# Patient Record
Sex: Female | Born: 1955 | Race: White | Hispanic: No | Marital: Single | Smoking: Never smoker
Health system: Southern US, Community
[De-identification: ages and names within clinical notes are randomized; demographics above are authoritative.]

## PROBLEM LIST (undated history)

## (undated) DIAGNOSIS — Z923 Personal history of irradiation: Secondary | ICD-10-CM

## (undated) DIAGNOSIS — Z9221 Personal history of antineoplastic chemotherapy: Secondary | ICD-10-CM

## (undated) DIAGNOSIS — Z87442 Personal history of urinary calculi: Secondary | ICD-10-CM

## (undated) DIAGNOSIS — C801 Malignant (primary) neoplasm, unspecified: Secondary | ICD-10-CM

## (undated) DIAGNOSIS — I10 Essential (primary) hypertension: Secondary | ICD-10-CM

## (undated) HISTORY — DX: Personal history of antineoplastic chemotherapy: Z92.21

## (undated) HISTORY — DX: Personal history of irradiation: Z92.3

## (undated) HISTORY — PX: BREAST LUMPECTOMY: SHX2

---

## 2010-06-28 HISTORY — PX: BREAST LUMPECTOMY: SHX2

## 2016-03-09 HISTORY — PX: LITHOTRIPSY: SUR834

## 2020-03-14 ENCOUNTER — Other Ambulatory Visit: Payer: Self-pay

## 2020-03-14 ENCOUNTER — Ambulatory Visit (INDEPENDENT_AMBULATORY_CARE_PROVIDER_SITE_OTHER): Payer: No Typology Code available for payment source | Admitting: Urology

## 2020-03-14 ENCOUNTER — Ambulatory Visit
Admission: RE | Admit: 2020-03-14 | Discharge: 2020-03-14 | Disposition: A | Discharge status: Home / Self Care | Payer: No Typology Code available for payment source | Source: Ambulatory Visit | Attending: Urology | Admitting: Urology

## 2020-03-14 VITALS — BP 173/77 | HR 85 | Ht 62.0 in | Wt 110.6 lb

## 2020-03-14 DIAGNOSIS — R300 Dysuria: Secondary | ICD-10-CM

## 2020-03-14 DIAGNOSIS — R109 Unspecified abdominal pain: Secondary | ICD-10-CM | POA: Diagnosis not present

## 2020-03-14 NOTE — Patient Instructions (Signed)
Kidney Stones  Kidney stones are solid, rock-like deposits that form inside of the kidneys. The kidneys are a pair of organs that make urine. A kidney stone may form in a kidney and move into other parts of the urinary tract, including the tubes that connect the kidneys to the bladder (ureters), the bladder, and the tube that carries urine out of the body (urethra). As the stone moves through these areas, it can cause intense pain and block the flow of urine. Kidney stones are created when high levels of certain minerals are found in the urine. The stones are usually passed out of the body through urination, but in some cases, medical treatment may be needed to remove them. What are the causes? Kidney stones may be caused by:  A condition in which certain glands produce too much parathyroid hormone (primary hyperparathyroidism), which causes too much calcium buildup in the blood.  A buildup of uric acid crystals in the bladder (hyperuricosuria). Uric acid is a chemical that the body produces when you eat certain foods. It usually exits the body in the urine.  Narrowing (stricture) of one or both of the ureters.  A kidney blockage that is present at birth (congenital obstruction).  Past surgery on the kidney or the ureters, such as gastric bypass surgery. What increases the risk? The following factors may make you more likely to develop this condition:  Having had a kidney stone in the past.  Having a family history of kidney stones.  Not drinking enough water.  Eating a diet that is high in protein, salt (sodium), or sugar.  Being overweight or obese. What are the signs or symptoms? Symptoms of a kidney stone may include:  Pain in the side of the abdomen, right below the ribs (flank pain). Pain usually spreads (radiates) to the groin.  Needing to urinate frequently or urgently.  Painful urination.  Blood in the urine (hematuria).  Nausea.  Vomiting.  Fever and chills. How  is this diagnosed? This condition may be diagnosed based on:  Your symptoms and medical history.  A physical exam.  Blood tests.  Urine tests. These may be done before and after the stone passes out of your body through urination.  Imaging tests, such as a CT scan, abdominal X-ray, or ultrasound.  A procedure to examine the inside of the bladder (cystoscopy). How is this treated? Treatment for kidney stones depends on the size, location, and makeup of the stones. Kidney stones will often pass out of the body through urination. You may need to:  Increase your fluid intake to help pass the stone. In some cases, you may be given fluids through an IV and may need to be monitored at the hospital.  Take medicine for pain.  Make changes in your diet to help prevent kidney stones from coming back. Sometimes, medical procedures are needed to remove a kidney stone. This may involve:  A procedure to break up kidney stones using: ? A focused beam of light (laser therapy). ? Shock waves (extracorporeal shock wave lithotripsy).  Surgery to remove kidney stones. This may be needed if you have severe pain or have stones that block your urinary tract. Follow these instructions at home: Medicines  Take over-the-counter and prescription medicines only as told by your health care provider.  Ask your health care provider if the medicine prescribed to you requires you to avoid driving or using heavy machinery. Eating and drinking  Drink enough fluid to keep your urine pale yellow.   You may be instructed to drink at least 8-10 glasses of water each day. This will help you pass the kidney stone.  If directed, change your diet. This may include: ? Limiting how much sodium you eat. ? Eating more fruits and vegetables. ? Limiting how much animal protein--such as red meat, poultry, fish, and eggs--you eat.  Follow instructions from your health care provider about eating or drinking  restrictions. General instructions  Collect urine samples as told by your health care provider. You may need to collect a urine sample: ? 24 hours after you pass the stone. ? 8-12 weeks after passing the kidney stone, and every 6-12 months after that.  Strain your urine every time you urinate, for as long as directed. Use the strainer that your health care provider recommends.  Do not throw out the kidney stone after passing it. Keep the stone so it can be tested by your health care provider. Testing the makeup of your kidney stone may help prevent you from getting kidney stones in the future.  Keep all follow-up visits as told by your health care provider. This is important. You may need follow-up X-rays or ultrasounds to make sure that your stone has passed. How is this prevented? To prevent another kidney stone:  Drink enough fluid to keep your urine pale yellow. This is the best way to prevent kidney stones.  Eat a healthy diet and follow recommendations from your health care provider about foods to avoid. You may be instructed to eat a low-protein diet. Recommendations vary depending on the type of kidney stone that you have.  Maintain a healthy weight. Where to find more information  National Kidney Foundation (NKF): www.kidney.org  [employer] (UCF): www.urologyhealth.org Contact a health care provider if:  You have pain that gets worse or does not get better with medicine. Get help right away if:  You have a fever or chills.  You develop severe pain.  You develop new abdominal pain.  You faint.  You are unable to urinate. Summary  Kidney stones are solid, rock-like deposits that form inside of the kidneys.  Kidney stones can cause nausea, vomiting, blood in the urine, abdominal pain, and the urge to urinate frequently.  Treatment for kidney stones depends on the size, location, and makeup of the stones. Kidney stones will often pass out of the body  through urination.  Kidney stones can be prevented by drinking enough fluids, eating a healthy diet, and maintaining a healthy weight. This information is not intended to replace advice given to you by your health care provider. Make sure you discuss any questions you have with your health care provider. Document Revised: 10/31/2018 Document Reviewed: 10/31/2018 Elsevier Patient Education  2020 Elsevier Inc.  

## 2020-03-14 NOTE — Progress Notes (Signed)
03/14/2020 2:46 PM   Kelly Hines 03-Sep-1955 [id number]  Referring provider: Ferdinand Cava, MD [facility] [place],  [place] [address]  CC: dysuria   HPI: Kelly Hines was referred over for dysuria and left LQ pain that started about 6 weeks ago. She hasn't seen a stone pass. The pain eased off and then she felt like a "stone stuck in her bladder". It felt "prickly". Now she has left flank, groin or bladder pain. She has no gross hematuria and was told her UA at PCP showed "no blood". Pain is manageable and more like a "discomfort". She also c/o constipation. She started Mag Citrate to tx constipation and prevent kidney stones.   She has h/o stones. She had ESWL in 2016 maybe twice that year for two stones in the left kidney. Done in [place]. She had left ESWL in 2017 for a "stone in the ureter" on the left and still gets pain over the LLQ site that comes and goes.   PMH: No past medical history on file.  Surgical History:   Home Medications:  Allergies as of 03/14/2020      Reactions   Sulfa Antibiotics Nausea And Vomiting      Medication List       Accurate as of March 14, 2020  2:46 PM. If you have any questions, ask your nurse or doctor.        lisinopril 20 MG tablet Commonly known as: ZESTRIL Take 20 mg by mouth daily.       Allergies:  Allergies  Allergen Reactions  . Sulfa Antibiotics Nausea And Vomiting    Family History: No family history on file.  Social History:  has no history on file for tobacco use, alcohol use, and drug use.   Physical Exam: BP (!) 173/77 (BP Location: Left Arm, Patient Position: Sitting, Cuff Size: Normal)   Pulse 85   Ht 5\' 2"  (1.575 m)   Wt 110 lb 9.6 oz (50.2 kg)   BMI 20.23 kg/m   Constitutional:  Alert and oriented, No acute distress. HEENT: [place] AT, moist mucus membranes.  Trachea midline, no masses. Cardiovascular: No clubbing, cyanosis, or edema. Respiratory: Normal respiratory effort, no increased work of  breathing. GI: Abdomen is soft, nontender, nondistended, no abdominal masses GU: No CVA tenderness Skin: No rashes, bruises or suspicious lesions. Neurologic: Grossly intact, no focal deficits, moving all 4 extremities. Psychiatric: Normal mood and affect.  Laboratory Data: No results found for: WBC, HGB, HCT, MCV, PLT  No results found for: CREATININE  No results found for: PSA  No results found for: TESTOSTERONE  No results found for: HGBA1C  Urinalysis No results found for: COLORURINE, APPEARANCEUR, LABSPEC, PHURINE, GLUCOSEU, HGBUR, BILIRUBINUR, KETONESUR, PROTEINUR, UROBILINOGEN, NITRITE, LEUKOCYTESUR  No results found for: LABMICR, WBCUA, RBCUA, LABEPIT, MUCUS, BACTERIA  Pertinent Imaging: n/a No results found for this or any previous visit.  No results found for this or any previous visit.  No results found for this or any previous visit.  No results found for this or any previous visit.  No results found for this or any previous visit.  No results found for this or any previous visit.  No results found for this or any previous visit.  No results found for this or any previous visit.   Assessment & Plan:    1. Flank pain, bladder pain - check KUB today  - Urinalysis, Complete   No follow-ups on file.  , MD  [facility] [phone]  [address], [place], [place] [address] [phone]

## 2020-03-17 ENCOUNTER — Telehealth: Payer: Self-pay | Admitting: Urology

## 2020-03-17 LAB — URINALYSIS, COMPLETE
Bilirubin, UA: NEGATIVE
Glucose, UA: NEGATIVE
Ketones, UA: NEGATIVE
Leukocytes,UA: NEGATIVE
Nitrite, UA: NEGATIVE
Protein,UA: NEGATIVE
Specific Gravity, UA: 1.015 (ref 1.005–1.030)
Urobilinogen, Ur: 0.2 mg/dL (ref 0.2–1.0)
pH, UA: 7.5 (ref 5.0–7.5)

## 2020-03-17 LAB — MICROSCOPIC EXAMINATION
Bacteria, UA: NONE SEEN
Epithelial Cells (non renal): NONE SEEN /hpf (ref 0–10)

## 2020-03-17 NOTE — Telephone Encounter (Signed)
LMOM asking patient to please return call. No DPR on file. Scheduled patient for 6 mo follow up with KUB prior, appt reminder printed and mailed.

## 2020-03-17 NOTE — Telephone Encounter (Signed)
PT AWARE  

## 2020-09-12 ENCOUNTER — Ambulatory Visit: Payer: No Typology Code available for payment source | Admitting: Urology

## 2021-06-09 ENCOUNTER — Other Ambulatory Visit: Payer: Self-pay

## 2021-06-11 ENCOUNTER — Other Ambulatory Visit: Payer: Self-pay

## 2021-06-12 ENCOUNTER — Ambulatory Visit: Payer: No Typology Code available for payment source | Admitting: Gastroenterology

## 2021-07-06 ENCOUNTER — Other Ambulatory Visit: Payer: Self-pay | Admitting: Internal Medicine

## 2021-07-06 DIAGNOSIS — Z1231 Encounter for screening mammogram for malignant neoplasm of breast: Secondary | ICD-10-CM

## 2021-07-10 ENCOUNTER — Ambulatory Visit: Payer: No Typology Code available for payment source | Admitting: Nurse Practitioner

## 2021-07-16 ENCOUNTER — Ambulatory Visit: Payer: No Typology Code available for payment source

## 2021-07-30 ENCOUNTER — Other Ambulatory Visit: Payer: Self-pay

## 2021-07-30 ENCOUNTER — Ambulatory Visit (INDEPENDENT_AMBULATORY_CARE_PROVIDER_SITE_OTHER): Payer: 59

## 2021-07-30 DIAGNOSIS — Z1231 Encounter for screening mammogram for malignant neoplasm of breast: Secondary | ICD-10-CM | POA: Diagnosis not present

## 2021-09-26 IMAGING — CR DG ABDOMEN 1V
1 series · 1 of 1 positions shown · non-contrast
Comparison: None.

CLINICAL DATA: Flank pain.  Constipation.

EXAM:
ABDOMEN - 1 VIEW

[dg abd 1 view]
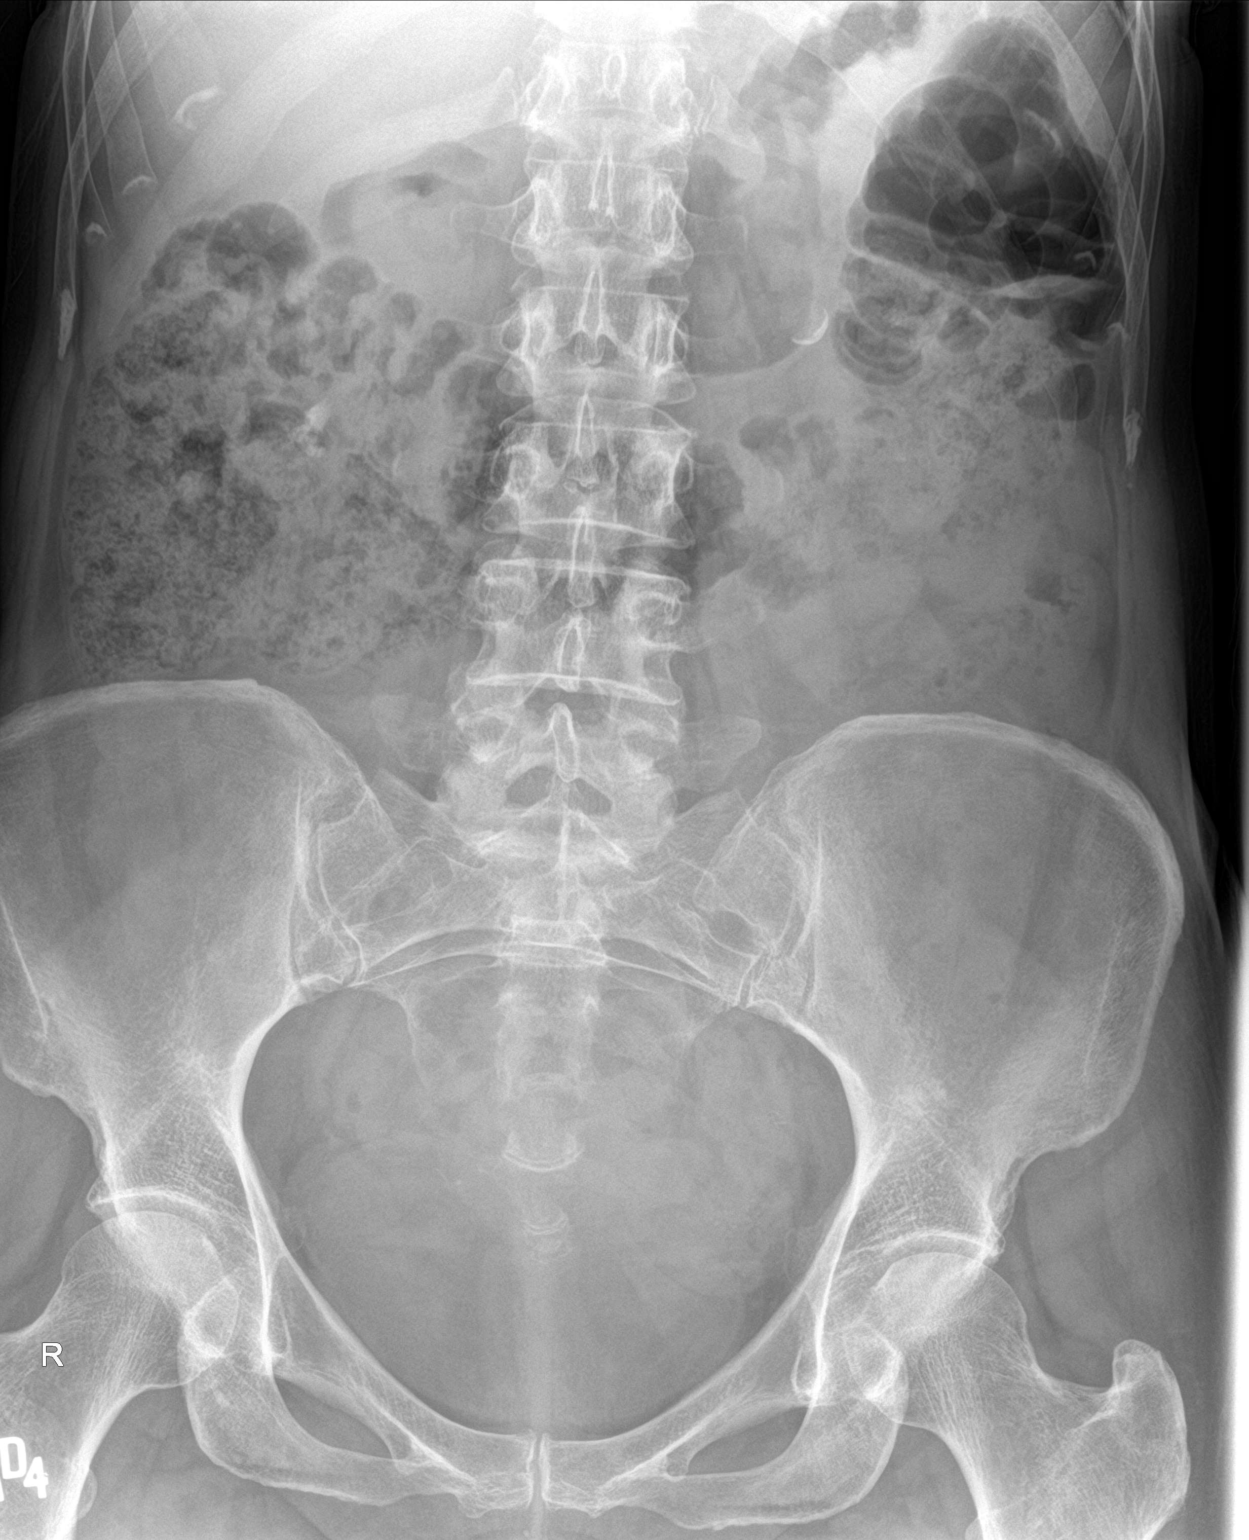

[1 of 1 positions shown; findings below may reference images not displayed]

FINDINGS: Cluster of calcifications overlying the inferior pole of right
kidney are noted measuring up to 7 mm. No calcifications identified
along the course of the ureters or within the urinary bladder. A
moderate stool burden is identified within the colon at the level of
the hepatic flexure and transverse colon. No small bowel dilatation
identified.
IMPRESSION: 1. Right renal calculi.
2. Moderate stool burden within the colon which may reflect
constipation.

## 2022-05-14 DIAGNOSIS — Z0000 Encounter for general adult medical examination without abnormal findings: Secondary | ICD-10-CM | POA: Diagnosis not present

## 2022-05-14 DIAGNOSIS — E782 Mixed hyperlipidemia: Secondary | ICD-10-CM | POA: Diagnosis not present

## 2022-05-14 DIAGNOSIS — I10 Essential (primary) hypertension: Secondary | ICD-10-CM | POA: Diagnosis not present

## 2022-11-26 DIAGNOSIS — Z1231 Encounter for screening mammogram for malignant neoplasm of breast: Secondary | ICD-10-CM | POA: Diagnosis not present

## 2022-12-17 DIAGNOSIS — R140 Abdominal distension (gaseous): Secondary | ICD-10-CM | POA: Diagnosis not present

## 2022-12-22 DIAGNOSIS — L570 Actinic keratosis: Secondary | ICD-10-CM | POA: Diagnosis not present

## 2022-12-22 DIAGNOSIS — L821 Other seborrheic keratosis: Secondary | ICD-10-CM | POA: Diagnosis not present

## 2022-12-22 DIAGNOSIS — D225 Melanocytic nevi of trunk: Secondary | ICD-10-CM | POA: Diagnosis not present

## 2022-12-22 DIAGNOSIS — X32XXXA Exposure to sunlight, initial encounter: Secondary | ICD-10-CM | POA: Diagnosis not present

## 2022-12-22 DIAGNOSIS — D2262 Melanocytic nevi of left upper limb, including shoulder: Secondary | ICD-10-CM | POA: Diagnosis not present

## 2022-12-22 DIAGNOSIS — D485 Neoplasm of uncertain behavior of skin: Secondary | ICD-10-CM | POA: Diagnosis not present

## 2022-12-29 DIAGNOSIS — N958 Other specified menopausal and perimenopausal disorders: Secondary | ICD-10-CM | POA: Diagnosis not present

## 2022-12-29 DIAGNOSIS — M8588 Other specified disorders of bone density and structure, other site: Secondary | ICD-10-CM | POA: Diagnosis not present

## 2022-12-29 DIAGNOSIS — E349 Endocrine disorder, unspecified: Secondary | ICD-10-CM | POA: Diagnosis not present

## 2022-12-29 DIAGNOSIS — Z853 Personal history of malignant neoplasm of breast: Secondary | ICD-10-CM | POA: Diagnosis not present

## 2022-12-29 DIAGNOSIS — Z8262 Family history of osteoporosis: Secondary | ICD-10-CM | POA: Diagnosis not present

## 2022-12-31 DIAGNOSIS — R140 Abdominal distension (gaseous): Secondary | ICD-10-CM | POA: Diagnosis not present

## 2023-01-26 DIAGNOSIS — L603 Nail dystrophy: Secondary | ICD-10-CM | POA: Diagnosis not present

## 2023-01-26 DIAGNOSIS — D485 Neoplasm of uncertain behavior of skin: Secondary | ICD-10-CM | POA: Diagnosis not present

## 2023-01-26 DIAGNOSIS — D2262 Melanocytic nevi of left upper limb, including shoulder: Secondary | ICD-10-CM | POA: Diagnosis not present

## 2023-02-11 DIAGNOSIS — R1084 Generalized abdominal pain: Secondary | ICD-10-CM | POA: Diagnosis not present

## 2023-02-11 DIAGNOSIS — R140 Abdominal distension (gaseous): Secondary | ICD-10-CM | POA: Diagnosis not present

## 2023-02-25 DIAGNOSIS — R140 Abdominal distension (gaseous): Secondary | ICD-10-CM | POA: Diagnosis not present

## 2023-03-24 ENCOUNTER — Other Ambulatory Visit: Payer: Self-pay | Admitting: Family Medicine

## 2023-03-24 ENCOUNTER — Ambulatory Visit
Admission: RE | Admit: 2023-03-24 | Discharge: 2023-03-24 | Disposition: A | Discharge status: Home / Self Care | Payer: Medicare HMO | Source: Ambulatory Visit | Attending: Family Medicine | Admitting: Family Medicine

## 2023-03-24 DIAGNOSIS — Z8739 Personal history of other diseases of the musculoskeletal system and connective tissue: Secondary | ICD-10-CM

## 2023-03-24 DIAGNOSIS — M19072 Primary osteoarthritis, left ankle and foot: Secondary | ICD-10-CM | POA: Diagnosis not present

## 2023-04-08 DIAGNOSIS — D3132 Benign neoplasm of left choroid: Secondary | ICD-10-CM | POA: Diagnosis not present

## 2023-04-08 DIAGNOSIS — H5203 Hypermetropia, bilateral: Secondary | ICD-10-CM | POA: Diagnosis not present

## 2023-04-08 DIAGNOSIS — H02834 Dermatochalasis of left upper eyelid: Secondary | ICD-10-CM | POA: Diagnosis not present

## 2023-04-08 DIAGNOSIS — H02831 Dermatochalasis of right upper eyelid: Secondary | ICD-10-CM | POA: Diagnosis not present

## 2023-04-08 DIAGNOSIS — H52203 Unspecified astigmatism, bilateral: Secondary | ICD-10-CM | POA: Diagnosis not present

## 2023-04-15 DIAGNOSIS — R140 Abdominal distension (gaseous): Secondary | ICD-10-CM | POA: Diagnosis not present

## 2023-05-20 DIAGNOSIS — Z87442 Personal history of urinary calculi: Secondary | ICD-10-CM | POA: Diagnosis not present

## 2023-05-20 DIAGNOSIS — I10 Essential (primary) hypertension: Secondary | ICD-10-CM | POA: Diagnosis not present

## 2023-05-20 DIAGNOSIS — Z1159 Encounter for screening for other viral diseases: Secondary | ICD-10-CM | POA: Diagnosis not present

## 2023-05-20 DIAGNOSIS — E782 Mixed hyperlipidemia: Secondary | ICD-10-CM | POA: Diagnosis not present

## 2023-05-20 DIAGNOSIS — E559 Vitamin D deficiency, unspecified: Secondary | ICD-10-CM | POA: Diagnosis not present

## 2023-05-20 DIAGNOSIS — Z853 Personal history of malignant neoplasm of breast: Secondary | ICD-10-CM | POA: Diagnosis not present

## 2023-05-20 DIAGNOSIS — Z23 Encounter for immunization: Secondary | ICD-10-CM | POA: Diagnosis not present

## 2023-05-20 DIAGNOSIS — M810 Age-related osteoporosis without current pathological fracture: Secondary | ICD-10-CM | POA: Diagnosis not present

## 2023-05-20 DIAGNOSIS — R140 Abdominal distension (gaseous): Secondary | ICD-10-CM | POA: Diagnosis not present

## 2023-05-20 DIAGNOSIS — Z0000 Encounter for general adult medical examination without abnormal findings: Secondary | ICD-10-CM | POA: Diagnosis not present

## 2023-05-24 DIAGNOSIS — N200 Calculus of kidney: Secondary | ICD-10-CM | POA: Diagnosis not present

## 2023-05-26 ENCOUNTER — Other Ambulatory Visit: Payer: Self-pay | Admitting: Medical Genetics

## 2023-06-01 ENCOUNTER — Other Ambulatory Visit: Payer: Self-pay | Admitting: Medical Genetics

## 2023-06-01 DIAGNOSIS — Z1283 Encounter for screening for malignant neoplasm of skin: Secondary | ICD-10-CM | POA: Diagnosis not present

## 2023-06-01 DIAGNOSIS — X32XXXD Exposure to sunlight, subsequent encounter: Secondary | ICD-10-CM | POA: Diagnosis not present

## 2023-06-01 DIAGNOSIS — L570 Actinic keratosis: Secondary | ICD-10-CM | POA: Diagnosis not present

## 2023-06-01 DIAGNOSIS — D225 Melanocytic nevi of trunk: Secondary | ICD-10-CM | POA: Diagnosis not present

## 2023-07-01 ENCOUNTER — Other Ambulatory Visit (HOSPITAL_COMMUNITY): Payer: Self-pay

## 2023-07-15 ENCOUNTER — Other Ambulatory Visit (HOSPITAL_COMMUNITY)
Admission: RE | Admit: 2023-07-15 | Discharge: 2023-07-15 | Disposition: A | Discharge status: Home / Self Care | Payer: Self-pay | Source: Ambulatory Visit | Attending: Medical Genetics | Admitting: Medical Genetics

## 2023-07-28 LAB — GENECONNECT MOLECULAR SCREEN: Genetic Analysis Overall Interpretation: NEGATIVE

## 2023-08-23 ENCOUNTER — Encounter (HOSPITAL_COMMUNITY): Payer: Self-pay

## 2023-08-23 ENCOUNTER — Other Ambulatory Visit: Payer: Self-pay

## 2023-08-23 ENCOUNTER — Ambulatory Visit (HOSPITAL_COMMUNITY)
Admission: EM | Admit: 2023-08-23 | Discharge: 2023-08-24 | Disposition: A | Discharge status: Home / Self Care | Payer: Medicare Other | Attending: Emergency Medicine | Admitting: Emergency Medicine

## 2023-08-23 ENCOUNTER — Emergency Department (HOSPITAL_COMMUNITY): Payer: Medicare Other

## 2023-08-23 DIAGNOSIS — I10 Essential (primary) hypertension: Secondary | ICD-10-CM | POA: Insufficient documentation

## 2023-08-23 DIAGNOSIS — N200 Calculus of kidney: Secondary | ICD-10-CM | POA: Diagnosis not present

## 2023-08-23 DIAGNOSIS — R509 Fever, unspecified: Secondary | ICD-10-CM | POA: Diagnosis not present

## 2023-08-23 DIAGNOSIS — K3580 Unspecified acute appendicitis: Secondary | ICD-10-CM | POA: Diagnosis not present

## 2023-08-23 DIAGNOSIS — Z79899 Other long term (current) drug therapy: Secondary | ICD-10-CM | POA: Insufficient documentation

## 2023-08-23 DIAGNOSIS — K3589 Other acute appendicitis without perforation or gangrene: Secondary | ICD-10-CM | POA: Insufficient documentation

## 2023-08-23 DIAGNOSIS — I722 Aneurysm of renal artery: Secondary | ICD-10-CM | POA: Diagnosis not present

## 2023-08-23 DIAGNOSIS — R1031 Right lower quadrant pain: Secondary | ICD-10-CM | POA: Diagnosis not present

## 2023-08-23 DIAGNOSIS — K5730 Diverticulosis of large intestine without perforation or abscess without bleeding: Secondary | ICD-10-CM | POA: Diagnosis not present

## 2023-08-23 HISTORY — DX: Malignant (primary) neoplasm, unspecified: C80.1

## 2023-08-23 HISTORY — DX: Personal history of urinary calculi: Z87.442

## 2023-08-23 HISTORY — DX: Essential (primary) hypertension: I10

## 2023-08-23 LAB — COMPREHENSIVE METABOLIC PANEL
ALT: 21 U/L (ref 0–44)
AST: 25 U/L (ref 15–41)
Albumin: 4.3 g/dL (ref 3.5–5.0)
Alkaline Phosphatase: 60 U/L (ref 38–126)
Anion gap: 14 (ref 5–15)
BUN: 9 mg/dL (ref 8–23)
CO2: 24 mmol/L (ref 22–32)
Calcium: 10 mg/dL (ref 8.9–10.3)
Chloride: 94 mmol/L — ABNORMAL LOW (ref 98–111)
Creatinine, Ser: 0.81 mg/dL (ref 0.44–1.00)
GFR, Estimated: >60 mL/min (ref >60)
Glucose, Bld: 90 mg/dL (ref 70–99)
Potassium: 4.1 mmol/L (ref 3.5–5.1)
Sodium: 132 mmol/L — ABNORMAL LOW (ref 135–145)
Total Bilirubin: 1 mg/dL (ref 0.0–1.2)
Total Protein: 7.7 g/dL (ref 6.5–8.1)

## 2023-08-23 LAB — CBC
HCT: 40.1 % (ref 36.0–46.0)
Hemoglobin: 13.7 g/dL (ref 12.0–15.0)
MCH: 30.8 pg (ref 26.0–34.0)
MCHC: 34.2 g/dL (ref 30.0–36.0)
MCV: 90.1 fL (ref 80.0–100.0)
Platelets: 191 10*3/uL (ref 150–400)
RBC: 4.45 MIL/uL (ref 3.87–5.11)
RDW: 11.7 % (ref 11.5–15.5)
WBC: 8.9 10*3/uL (ref 4.0–10.5)
nRBC: 0 % (ref 0.0–0.2)

## 2023-08-23 LAB — URINALYSIS, ROUTINE W REFLEX MICROSCOPIC
Bilirubin Urine: NEGATIVE
Glucose, UA: NEGATIVE mg/dL
Hgb urine dipstick: NEGATIVE
Ketones, ur: 20 mg/dL — AB
Leukocytes,Ua: NEGATIVE
Nitrite: NEGATIVE
Protein, ur: NEGATIVE mg/dL
Specific Gravity, Urine: 1.006 (ref 1.005–1.030)
pH: 7 (ref 5.0–8.0)

## 2023-08-23 LAB — LIPASE, BLOOD: Lipase: 37 U/L (ref 11–51)

## 2023-08-23 MED ORDER — IOHEXOL 350 MG/ML SOLN
75.0000 mL | Freq: Once | INTRAVENOUS | Status: AC | PRN
Start: 2023-08-23 — End: 2023-08-23
  Administered 2023-08-23: 75 mL via INTRAVENOUS

## 2023-08-23 NOTE — ED Notes (Signed)
 Patient reports that she has not had anything to eat or drink since 0930 this morning.

## 2023-08-23 NOTE — ED Provider Triage Note (Signed)
 Emergency Medicine Provider Triage Evaluation Note  Ellina Sivertsen , a 68 y.o. female  was evaluated in triage.  Pt complains of right lower quadrant abdominal pain since Sunday.  Reports that she has had a decreased appetite but denies nausea, vomiting, diarrhea.  Reports elevated temperature at home but denies any overt fevers.  Has positive Rovsing sign on examination.  No dysuria, flank pain.  Review of Systems  Positive:  Negative:   Physical Exam  BP (!) 160/70 (BP Location: Left Arm)   Pulse 100   Temp 98.8 F (37.1 C)   Resp 16   SpO2 97%  Gen:   Awake, no distress   Resp:  Normal effort  MSK:   Moves extremities without difficulty  Other:  Positive Rovsing sign  Medical Decision Making  Medically screening exam initiated at 7:20 PM.  Appropriate orders placed.  Margene Fettes was informed that the remainder of the evaluation will be completed by another provider, this initial triage assessment does not replace that evaluation, and the importance of remaining in the ED until their evaluation is complete.     Al Decant, PA-C 08/23/23 1921

## 2023-08-23 NOTE — ED Triage Notes (Signed)
 Pt is coming in for RLQ pain that has been ongoing for a couple days, she mentions she went to a walk in clinic this afternoon and they recommended her come in for CT scan. The pain is pin point in location with no radiation of the pain. Mild nausea and no vomiting mentioned. Pt is otherwise stable.

## 2023-08-24 ENCOUNTER — Emergency Department (HOSPITAL_BASED_OUTPATIENT_CLINIC_OR_DEPARTMENT_OTHER): Payer: Medicare Other | Admitting: Certified Registered"

## 2023-08-24 ENCOUNTER — Encounter (HOSPITAL_COMMUNITY): Payer: Self-pay

## 2023-08-24 ENCOUNTER — Other Ambulatory Visit: Payer: Self-pay

## 2023-08-24 ENCOUNTER — Encounter (HOSPITAL_COMMUNITY)
Admission: EM | Disposition: A | Discharge status: Home / Self Care | Payer: Self-pay | Source: Home / Self Care | Attending: Emergency Medicine

## 2023-08-24 ENCOUNTER — Emergency Department (HOSPITAL_COMMUNITY): Payer: Medicare Other | Admitting: Certified Registered"

## 2023-08-24 DIAGNOSIS — K3580 Unspecified acute appendicitis: Secondary | ICD-10-CM

## 2023-08-24 DIAGNOSIS — K353 Acute appendicitis with localized peritonitis, without perforation or gangrene: Secondary | ICD-10-CM | POA: Diagnosis not present

## 2023-08-24 DIAGNOSIS — I10 Essential (primary) hypertension: Secondary | ICD-10-CM

## 2023-08-24 HISTORY — PX: LAPAROSCOPIC APPENDECTOMY: SHX408

## 2023-08-24 SURGERY — APPENDECTOMY, LAPAROSCOPIC
Anesthesia: General | Site: Abdomen

## 2023-08-24 MED ORDER — DEXAMETHASONE SODIUM PHOSPHATE 10 MG/ML IJ SOLN
INTRAMUSCULAR | Status: DC | PRN
  Administered 2023-08-24: 5 mg via INTRAVENOUS

## 2023-08-24 MED ORDER — METRONIDAZOLE 500 MG/100ML IV SOLN
500.0000 mg | Freq: Once | INTRAVENOUS | Status: AC
  Administered 2023-08-24: 500 mg via INTRAVENOUS
  Filled 2023-08-24: qty 100

## 2023-08-24 MED ORDER — MIDAZOLAM HCL 2 MG/2ML IJ SOLN
INTRAMUSCULAR | Status: DC | PRN
  Administered 2023-08-24: 1 mg via INTRAVENOUS

## 2023-08-24 MED ORDER — AMISULPRIDE (ANTIEMETIC) 5 MG/2ML IV SOLN
10.0000 mg | Freq: Once | INTRAVENOUS | Status: AC | PRN
  Administered 2023-08-24: 10 mg via INTRAVENOUS

## 2023-08-24 MED ORDER — PHENYLEPHRINE 80 MCG/ML (10ML) SYRINGE FOR IV PUSH (FOR BLOOD PRESSURE SUPPORT)
PREFILLED_SYRINGE | INTRAVENOUS | Status: DC | PRN
  Administered 2023-08-24 (×3): 160 ug via INTRAVENOUS

## 2023-08-24 MED ORDER — ACETAMINOPHEN 10 MG/ML IV SOLN
INTRAVENOUS | Status: DC | PRN
  Administered 2023-08-24: 750 mg via INTRAVENOUS

## 2023-08-24 MED ORDER — MIDAZOLAM HCL 2 MG/2ML IJ SOLN
INTRAMUSCULAR | Status: AC
  Filled 2023-08-24: qty 2

## 2023-08-24 MED ORDER — FENTANYL CITRATE PF 50 MCG/ML IJ SOSY
50.0000 ug | PREFILLED_SYRINGE | Freq: Once | INTRAMUSCULAR | Status: AC
  Administered 2023-08-24: 50 ug via INTRAVENOUS
  Filled 2023-08-24: qty 1

## 2023-08-24 MED ORDER — EPHEDRINE 5 MG/ML INJ
INTRAVENOUS | Status: AC
  Filled 2023-08-24: qty 5

## 2023-08-24 MED ORDER — LIDOCAINE 2% (20 MG/ML) 5 ML SYRINGE
INTRAMUSCULAR | Status: DC | PRN
  Administered 2023-08-24: 40 mg via INTRAVENOUS

## 2023-08-24 MED ORDER — FENTANYL CITRATE (PF) 250 MCG/5ML IJ SOLN
INTRAMUSCULAR | Status: DC | PRN
Start: 2023-08-24 — End: 2023-08-24
  Administered 2023-08-24: 100 ug via INTRAVENOUS
  Administered 2023-08-24 (×3): 50 ug via INTRAVENOUS

## 2023-08-24 MED ORDER — OXYCODONE HCL 5 MG PO TABS: 5.0000 mg | ORAL_TABLET | Freq: Once | ORAL | Status: DC | PRN

## 2023-08-24 MED ORDER — MEPERIDINE HCL 25 MG/ML IJ SOLN: 6.2500 mg | INTRAMUSCULAR | Status: DC | PRN

## 2023-08-24 MED ORDER — ROCURONIUM BROMIDE 10 MG/ML (PF) SYRINGE
PREFILLED_SYRINGE | INTRAVENOUS | Status: DC | PRN
  Administered 2023-08-24: 20 mg via INTRAVENOUS
  Administered 2023-08-24: 10 mg via INTRAVENOUS

## 2023-08-24 MED ORDER — SUCCINYLCHOLINE CHLORIDE 200 MG/10ML IV SOSY
PREFILLED_SYRINGE | INTRAVENOUS | Status: AC
  Filled 2023-08-24: qty 10

## 2023-08-24 MED ORDER — SUGAMMADEX SODIUM 200 MG/2ML IV SOLN
INTRAVENOUS | Status: AC
Start: 2023-08-24 — End: ?
  Filled 2023-08-24: qty 2

## 2023-08-24 MED ORDER — ONDANSETRON HCL 4 MG/2ML IJ SOLN
INTRAMUSCULAR | Status: AC
  Filled 2023-08-24: qty 2

## 2023-08-24 MED ORDER — PROPOFOL 10 MG/ML IV BOLUS
INTRAVENOUS | Status: AC
  Filled 2023-08-24: qty 20

## 2023-08-24 MED ORDER — ORAL CARE MOUTH RINSE: 15.0000 mL | Freq: Once | OROMUCOSAL | Status: AC

## 2023-08-24 MED ORDER — LACTATED RINGERS IV SOLN: INTRAVENOUS | Status: DC

## 2023-08-24 MED ORDER — HYDROMORPHONE HCL 1 MG/ML IJ SOLN: 0.2500 mg | INTRAMUSCULAR | Status: DC | PRN

## 2023-08-24 MED ORDER — BUPIVACAINE-EPINEPHRINE (PF) 0.25% -1:200000 IJ SOLN
INTRAMUSCULAR | Status: AC
  Filled 2023-08-24: qty 30

## 2023-08-24 MED ORDER — ONDANSETRON HCL 4 MG/2ML IJ SOLN
4.0000 mg | Freq: Four times a day (QID) | INTRAMUSCULAR | Status: DC | PRN
Start: 2023-08-24 — End: 2023-08-24

## 2023-08-24 MED ORDER — ONDANSETRON HCL 4 MG/2ML IJ SOLN
INTRAMUSCULAR | Status: DC | PRN
  Administered 2023-08-24: 4 mg via INTRAVENOUS

## 2023-08-24 MED ORDER — PHENYLEPHRINE 80 MCG/ML (10ML) SYRINGE FOR IV PUSH (FOR BLOOD PRESSURE SUPPORT)
PREFILLED_SYRINGE | INTRAVENOUS | Status: AC
Start: 2023-08-24 — End: ?
  Filled 2023-08-24: qty 10

## 2023-08-24 MED ORDER — OXYCODONE HCL 5 MG/5ML PO SOLN: 5.0000 mg | Freq: Once | ORAL | Status: DC | PRN

## 2023-08-24 MED ORDER — 0.9 % SODIUM CHLORIDE (POUR BTL) OPTIME
TOPICAL | Status: DC | PRN
  Administered 2023-08-24: 1000 mL

## 2023-08-24 MED ORDER — SUCCINYLCHOLINE CHLORIDE 200 MG/10ML IV SOSY
PREFILLED_SYRINGE | INTRAVENOUS | Status: DC | PRN
  Administered 2023-08-24: 120 mg via INTRAVENOUS

## 2023-08-24 MED ORDER — SODIUM CHLORIDE 0.9 % IR SOLN
Status: DC | PRN
Start: 2023-08-24 — End: 2023-08-24
  Administered 2023-08-24: 1000 mL

## 2023-08-24 MED ORDER — CHLORHEXIDINE GLUCONATE 0.12 % MT SOLN: 15.0000 mL | Freq: Once | OROMUCOSAL | Status: AC

## 2023-08-24 MED ORDER — AMISULPRIDE (ANTIEMETIC) 5 MG/2ML IV SOLN
INTRAVENOUS | Status: DC
Start: 2023-08-24 — End: 2023-08-24
  Filled 2023-08-24: qty 4

## 2023-08-24 MED ORDER — LIDOCAINE 2% (20 MG/ML) 5 ML SYRINGE
INTRAMUSCULAR | Status: AC
  Filled 2023-08-24: qty 5

## 2023-08-24 MED ORDER — BUPIVACAINE-EPINEPHRINE 0.25% -1:200000 IJ SOLN
INTRAMUSCULAR | Status: DC | PRN
  Administered 2023-08-24: 8 mL

## 2023-08-24 MED ORDER — CHLORHEXIDINE GLUCONATE 0.12 % MT SOLN
OROMUCOSAL | Status: AC
Start: 2023-08-24 — End: 2023-08-24
  Administered 2023-08-24: 15 mL via OROMUCOSAL
  Filled 2023-08-24: qty 15

## 2023-08-24 MED ORDER — SODIUM CHLORIDE 0.9 % IV SOLN
2.0000 g | Freq: Once | INTRAVENOUS | Status: AC
  Administered 2023-08-24: 2 g via INTRAVENOUS
  Filled 2023-08-24: qty 20

## 2023-08-24 MED ORDER — ROCURONIUM BROMIDE 10 MG/ML (PF) SYRINGE
PREFILLED_SYRINGE | INTRAVENOUS | Status: AC
  Filled 2023-08-24: qty 10

## 2023-08-24 MED ORDER — OXYCODONE HCL 5 MG PO TABS: 5.0000 mg | ORAL_TABLET | Freq: Four times a day (QID) | ORAL | 0 refills | Status: AC | PRN

## 2023-08-24 MED ORDER — SUGAMMADEX SODIUM 200 MG/2ML IV SOLN
INTRAVENOUS | Status: DC | PRN
Start: 2023-08-24 — End: 2023-08-24
  Administered 2023-08-24: 100 mg via INTRAVENOUS

## 2023-08-24 MED ORDER — PROPOFOL 10 MG/ML IV BOLUS
INTRAVENOUS | Status: DC | PRN
  Administered 2023-08-24: 150 ug via INTRAVENOUS

## 2023-08-24 MED ORDER — FENTANYL CITRATE (PF) 250 MCG/5ML IJ SOLN
INTRAMUSCULAR | Status: AC
  Filled 2023-08-24: qty 5

## 2023-08-24 MED ORDER — SODIUM CHLORIDE 0.9 % IV SOLN: 12.5000 mg | INTRAVENOUS | Status: DC | PRN

## 2023-08-24 MED ORDER — KETOROLAC TROMETHAMINE 30 MG/ML IJ SOLN
INTRAMUSCULAR | Status: AC
Start: 2023-08-24 — End: ?
  Filled 2023-08-24: qty 1

## 2023-08-24 MED ORDER — DEXAMETHASONE SODIUM PHOSPHATE 10 MG/ML IJ SOLN
INTRAMUSCULAR | Status: AC
  Filled 2023-08-24: qty 1

## 2023-08-24 MED ORDER — KETOROLAC TROMETHAMINE 30 MG/ML IJ SOLN
INTRAMUSCULAR | Status: DC | PRN
Start: 2023-08-24 — End: 2023-08-24
  Administered 2023-08-24: 15 mg via INTRAVENOUS

## 2023-08-24 SURGICAL SUPPLY — 44 items
BAG COUNTER SPONGE SURGICOUNT (BAG) ×1 IMPLANT
BENZOIN TINCTURE PRP APPL 2/3 (GAUZE/BANDAGES/DRESSINGS) ×1 IMPLANT
BLADE CLIPPER SURG (BLADE) IMPLANT
CANISTER SUCT 3000ML PPV (MISCELLANEOUS) IMPLANT
CHLORAPREP W/TINT 26 (MISCELLANEOUS) ×1 IMPLANT
CLSR STERI-STRIP ANTIMIC 1/2X4 (GAUZE/BANDAGES/DRESSINGS) IMPLANT
COVER SURGICAL LIGHT HANDLE (MISCELLANEOUS) ×1 IMPLANT
CUTTER FLEX LINEAR 45M (STAPLE) ×1 IMPLANT
DRSG TEGADERM 2-3/8X2-3/4 SM (GAUZE/BANDAGES/DRESSINGS) ×2 IMPLANT
DRSG TEGADERM 4X4.75 (GAUZE/BANDAGES/DRESSINGS) ×1 IMPLANT
ELECT REM PT RETURN 9FT ADLT (ELECTROSURGICAL) ×1 IMPLANT
ELECTRODE REM PT RTRN 9FT ADLT (ELECTROSURGICAL) ×1 IMPLANT
ENDOLOOP SUT PDS II 0 18 (SUTURE) IMPLANT
GAUZE SPONGE 2X2 8PLY STRL LF (GAUZE/BANDAGES/DRESSINGS) ×1 IMPLANT
GAUZE SPONGE 2X2 STRL 8-PLY (GAUZE/BANDAGES/DRESSINGS) IMPLANT
GLOVE BIO SURGEON STRL SZ7 (GLOVE) ×1 IMPLANT
GLOVE BIOGEL PI IND STRL 7.5 (GLOVE) ×1 IMPLANT
GOWN STRL REUS W/ TWL LRG LVL3 (GOWN DISPOSABLE) ×3 IMPLANT
IRRIG SUCT STRYKERFLOW 2 WTIP (MISCELLANEOUS) ×1 IMPLANT
IRRIGATION SUCT STRKRFLW 2 WTP (MISCELLANEOUS) IMPLANT
KIT BASIN OR (CUSTOM PROCEDURE TRAY) ×1 IMPLANT
KIT TURNOVER KIT B (KITS) ×1 IMPLANT
NS IRRIG 1000ML POUR BTL (IV SOLUTION) ×1 IMPLANT
PAD ARMBOARD 7.5X6 YLW CONV (MISCELLANEOUS) ×2 IMPLANT
RELOAD STAPLE 45 3.5 BLU ETS (ENDOMECHANICALS) ×1 IMPLANT
RELOAD STAPLE TA45 3.5 REG BLU (ENDOMECHANICALS) ×1 IMPLANT
SCISSORS LAP 5X35 DISP (ENDOMECHANICALS) IMPLANT
SET TUBE SMOKE EVAC HIGH FLOW (TUBING) ×1 IMPLANT
SHEARS 1100 HARMONIC 36 (ELECTROSURGICAL) IMPLANT
SHEARS HARMONIC ACE PLUS 36CM (ENDOMECHANICALS) ×1 IMPLANT
SLEEVE Z-THREAD 5X100MM (TROCAR) ×1 IMPLANT
SPECIMEN JAR SMALL (MISCELLANEOUS) ×1 IMPLANT
STRIP CLOSURE SKIN 1/2X4 (GAUZE/BANDAGES/DRESSINGS) ×1 IMPLANT
SUT MNCRL AB 4-0 PS2 18 (SUTURE) ×1 IMPLANT
SUT VICRYL 0 UR6 27IN ABS (SUTURE) IMPLANT
SYS BAG RETRIEVAL 10MM (BASKET) ×1 IMPLANT
SYSTEM BAG RETRIEVAL 10MM (BASKET) ×1 IMPLANT
TOWEL GREEN STERILE (TOWEL DISPOSABLE) ×1 IMPLANT
TOWEL GREEN STERILE FF (TOWEL DISPOSABLE) ×1 IMPLANT
TRAY LAPAROSCOPIC MC (CUSTOM PROCEDURE TRAY) ×1 IMPLANT
TROCAR BALLN 12MMX100 BLUNT (TROCAR) ×1 IMPLANT
TROCAR Z-THREAD OPTICAL 5X100M (TROCAR) ×1 IMPLANT
WARMER LAPAROSCOPE (MISCELLANEOUS) ×1 IMPLANT
WATER STERILE IRR 1000ML POUR (IV SOLUTION) ×1 IMPLANT

## 2023-08-24 NOTE — Transfer of Care (Signed)
 Immediate Anesthesia Transfer of Care Note  Patient: Kelly Hines  Procedure(s) Performed: APPENDECTOMY LAPAROSCOPIC (Abdomen)  Patient Location: PACU  Anesthesia Type:General  Level of Consciousness: awake, alert , and oriented  Airway & Oxygen Therapy: Patient Spontanous Breathing  Post-op Assessment: Report given to RN and Post -op Vital signs reviewed and stable  Post vital signs: Reviewed and stable  Last Vitals:  Vitals Value Taken Time  BP 122/59 08/24/23 1130  Temp 36.8 C 08/24/23 1130  Pulse 83 08/24/23 1136  Resp 12 08/24/23 1136  SpO2 97 % 08/24/23 1136  Vitals shown include unfiled device data.  Last Pain:  Vitals:   08/24/23 0945  TempSrc: Oral  PainSc: 1          Complications: No notable events documented.

## 2023-08-24 NOTE — Anesthesia Preprocedure Evaluation (Signed)
 Anesthesia Evaluation    Airway Mallampati: II  TM Distance: >3 FB Neck ROM: Full    Dental no notable dental hx.    Pulmonary    Pulmonary exam normal breath sounds clear to auscultation       Cardiovascular hypertension, Pt. on medications Normal cardiovascular exam Rhythm:Regular Rate:Normal     Neuro/Psych    GI/Hepatic   Endo/Other    Renal/GU      Musculoskeletal   Abdominal   Peds  Hematology   Anesthesia Other Findings   Reproductive/Obstetrics                             Anesthesia Physical Anesthesia Plan  ASA: 2  Anesthesia Plan: General   Post-op Pain Management:    Induction: Intravenous  PONV Risk Score and Plan: 3 and Ondansetron, Dexamethasone, Midazolam and Treatment may vary due to age or medical condition  Airway Management Planned: Oral ETT  Additional Equipment:   Intra-op Plan:   Post-operative Plan: Extubation in OR  Informed Consent: I have reviewed the patients History and Physical, chart, labs and discussed the procedure including the risks, benefits and alternatives for the proposed anesthesia with the patient or authorized representative who has indicated his/her understanding and acceptance.     Dental advisory given  Plan Discussed with: CRNA  Anesthesia Plan Comments:        Anesthesia Quick Evaluation

## 2023-08-24 NOTE — Discharge Instructions (Signed)
[facility] ______[facility] ?LAPAROSCOPIC SURGERY: POST OP INSTRUCTIONS ?Always review your discharge instruction sheet given to you by the facility where your surgery was performed. ?IF YOU HAVE DISABILITY OR FAMILY LEAVE FORMS, YOU MUST BRING THEM TO THE OFFICE FOR PROCESSING.   ?DO NOT GIVE THEM TO YOUR DOCTOR. ? ?A prescription for pain medication may be given to you upon discharge.  Take your pain medication as prescribed, if needed.  If narcotic pain medicine is not needed, then you may take acetaminophen (Tylenol) or ibuprofen (Advil) as needed. ?Take your usually prescribed medications unless otherwise directed. ?If you need a refill on your pain medication, please contact your pharmacy.  They will contact our office to request authorization. Prescriptions will not be filled after 5pm or on week-ends. ?You should follow a light diet the first few days after arrival home, such as soup and crackers, etc.  Be sure to include lots of fluids daily. ?Most patients will experience some swelling and bruising in the area of the incisions.  Ice packs will help.  Swelling and bruising can take several days to resolve.  ?It is common to experience some constipation if taking pain medication after surgery.  Increasing fluid intake and taking a stool softener (such as Colace) will usually help or prevent this problem from occurring.  A mild laxative (Milk of Magnesia or Miralax) should be taken according to package instructions if there are no bowel movements after 48 hours. ?Unless discharge instructions indicate otherwise, you may remove your bandages 24-48 hours after surgery, and you may shower at that time.  You may have steri-strips (small skin tapes) in place directly over the incision.  These strips should be left on the skin for 7-10 days.  ?ACTIVITIES:  You may resume regular (light) daily activities beginning the next day--such as daily self-care, walking, climbing stairs--gradually increasing  activities as tolerated.  You may have sexual intercourse when it is comfortable.  Refrain from any heavy lifting or straining until approved by your doctor. ?You may drive when you are no longer taking prescription pain medication, you can comfortably wear a seatbelt, and you can safely maneuver your car and apply brakes. ?RETURN TO WORK:  __________________________________________________________ ?You should see your doctor in the office for a follow-up appointment approximately 2-3 weeks after your surgery.  Make sure that you call for this appointment within a day or two after you arrive home to insure a convenient appointment time. ?OTHER INSTRUCTIONS: __________________________________________________________________________________________________________________________ __________________________________________________________________________________________________________________________ ?WHEN TO CALL YOUR DOCTOR: ?Fever over 101.0 ?Inability to urinate ?Continued bleeding from incision. ?Increased pain, redness, or drainage from the incision. ?Increasing abdominal pain ? ?The clinic staff is available to answer your questions during regular business hours.  Please don?t hesitate to call and ask to speak to one of the nurses for clinical concerns.  If you have a medical emergency, go to the nearest emergency room or call 911.  A surgeon from [facility] is always on call at the hospital. ?[address], [address], [place], [place]  [address] ? [address] [address], [place], [place]   [address] ?[phone] ? [phone] ? FAX [phone] ?Web site: [email or web address] ? ?

## 2023-08-24 NOTE — Interval H&P Note (Signed)
 History and Physical Interval Note:  08/24/2023 10:13 AM  Kelly Hines  has presented today for surgery, with the diagnosis of Acute Appendicitis.  The various methods of treatment have been discussed with the patient and family. After consideration of risks, benefits and other options for treatment, the patient has consented to  Procedure(s): APPENDECTOMY LAPAROSCOPIC (N/A) as a surgical intervention.  The patient's history has been reviewed, patient examined, no change in status, stable for surgery.  I have reviewed the patient's chart and labs.  Questions were answered to the patient's satisfaction.     [name]

## 2023-08-24 NOTE — ED Provider Notes (Signed)
 [facility] AT [facility] Provider Note   CSN: [id number] Arrival date & time: 08/23/23  1844     History  Chief Complaint  Patient presents with   Abdominal Pain    Kelly Hines is a 68 y.o. female.  The history is provided by the patient and medical records.  Abdominal Pain  68 y.o. F with hx of HTN, presenting to the ED with right lower abdominal pain for the past 48 hours.  States began on Sunday evening, initially mild but has increased in severity over the past few days.  States she has not had much of an appetite but denies any nausea, vomiting, or diarrhea.  No fever.  No urinary symptoms.  No prior abdominal surgeries.  Home Medications Prior to Admission medications   Medication Sig Start Date End Date Taking? Authorizing Provider  Cholecalciferol 25 MCG (1000 UT) tablet Take 1,000 Units by mouth.    [provider]  cloNIDine (CATAPRES) 0.1 MG tablet Take 0.1 mg by mouth 2 (two) times daily as needed. 04/12/21   [provider]  lisinopril (ZESTRIL) 20 MG tablet Take 20 mg by mouth daily. 01/30/20   [provider]  meloxicam (MOBIC) 15 MG tablet Take 15 mg by mouth daily. 04/12/21   [provider]      Allergies    Sulfa antibiotics    Review of Systems   Review of Systems  Gastrointestinal:  Positive for abdominal pain.  All other systems reviewed and are negative.   Physical Exam Updated Vital Signs BP (!) 160/70 (BP Location: Left Arm)   Pulse 100   Temp 98.8 F (37.1 C)   Resp 16   SpO2 97%  Physical Exam Vitals and nursing note reviewed.  Constitutional:      Appearance: She is well-developed.  HENT:     Head: Normocephalic and atraumatic.  Eyes:     Conjunctiva/sclera: Conjunctivae normal.     Pupils: Pupils are equal, round, and reactive to light.  Cardiovascular:     Rate and Rhythm: Normal rate and regular rhythm.     Heart sounds: Normal heart sounds.  Pulmonary:      Effort: Pulmonary effort is normal.     Breath sounds: Normal breath sounds.  Abdominal:     General: Bowel sounds are normal.     Palpations: Abdomen is soft.     Tenderness: There is abdominal tenderness in the right lower quadrant.  Musculoskeletal:        General: Normal range of motion.     Cervical back: Normal range of motion.  Skin:    General: Skin is warm and dry.  Neurological:     Mental Status: She is alert and oriented to person, place, and time.     ED Results / Procedures / Treatments   Labs (all labs ordered are listed, but only abnormal results are displayed) Labs Reviewed  COMPREHENSIVE METABOLIC PANEL - Abnormal; Notable for the following components:      Result Value   Sodium 132 (*)    Chloride 94 (*)    All other components within normal limits  URINALYSIS, ROUTINE W REFLEX MICROSCOPIC - Abnormal; Notable for the following components:   Ketones, ur 20 (*)    All other components within normal limits  CBC  LIPASE, BLOOD    EKG None  Radiology CT ABDOMEN PELVIS W CONTRAST Result Date: 08/23/2023 CLINICAL DATA:  Right lower quadrant pain EXAM: CT ABDOMEN AND PELVIS  WITH CONTRAST TECHNIQUE: Multidetector CT imaging of the abdomen and pelvis was performed using the standard protocol following bolus administration of intravenous contrast. RADIATION DOSE REDUCTION: This exam was performed according to the departmental dose-optimization program which includes automated exposure control, adjustment of the mA and/or kV according to patient size and/or use of iterative reconstruction technique. CONTRAST:  75mL OMNIPAQUE IOHEXOL 350 MG/ML SOLN COMPARISON:  None Available. FINDINGS: Lower chest: No acute abnormality Hepatobiliary: No focal hepatic abnormality. Gallbladder unremarkable. Pancreas: No focal abnormality or ductal dilatation. Spleen: No focal abnormality.  Normal size. Adrenals/Urinary Tract: Bilateral renal cysts appear benign. No follow-up imaging  recommended. Bilateral nephrolithiasis. No ureteral stones or hydronephrosis. Adrenal glands and urinary bladder unremarkable. Stomach/Bowel: Sigmoid diverticulosis. No active diverticulitis. Stomach and small bowel decompressed, unremarkable. Appendix is mildly dilated measuring 11 mm. Surrounding inflammation. Findings compatible with acute appendicitis. Vascular/Lymphatic: Aortic atherosclerosis. No evidence of aortic aneurysm or adenopathy. 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 Reproductive: Uterus and adnexa unremarkable.  No mass. Other: No free fluid or free air. Musculoskeletal: No acute bony abnormality. IMPRESSION: Dilated, inflamed appendix compatible with acute appendicitis. No complicating feature. Bilateral nephrolithiasis.  No hydronephrosis. Sigmoid diverticulosis.  No active diverticulitis. Aortic atherosclerosis. These results were called by telephone at the time of interpretation on 08/23/2023 at 9:19 pm to provider [facility] , who verbally acknowledged these results. Electronically Signed   By: Charlett Nose M.D.   On: 08/23/2023 21:22    Procedures Procedures    CRITICAL CARE Performed by: Garlon Hatchet   Total critical care time: 35 minutes  Critical care time was exclusive of separately billable procedures and treating other patients.  Critical care was necessary to treat or prevent imminent or life-threatening deterioration.  Critical care was time spent personally by me on the following activities: development of treatment plan with patient and/or surrogate as well as nursing, discussions with consultants, evaluation of patient's response to treatment, examination of patient, obtaining history from patient or surrogate, ordering and performing treatments and interventions, ordering and review of laboratory studies, ordering and review of radiographic studies, pulse oximetry and re-evaluation of patient's  condition.   Medications Ordered in ED Medications  cefTRIAXone (ROCEPHIN) 2 g in sodium chloride 0.9 % 100 mL IVPB (has no administration in time range)    And  metroNIDAZOLE (FLAGYL) IVPB 500 mg (has no administration in time range)  fentaNYL (SUBLIMAZE) injection 50 mcg (has no administration in time range)  ondansetron (ZOFRAN) injection 4 mg (has no administration in time range)  iohexol (OMNIPAQUE) 350 MG/ML injection 75 mL (75 mLs Intravenous Contrast Given 08/23/23 2102)    ED Course/ Medical Decision Making/ A&P Clinical Course as of 08/24/23 0157  Tue Aug 23, 2023  2121 Acute appendicitis, uncomplicated. [CG]    Clinical Course User Index [CG] Al Decant, PA-C                                 Medical Decision Making Amount and/or Complexity of Data Reviewed Labs: ordered. Radiology: ordered and independent interpretation performed. ECG/medicine tests: ordered and independent interpretation performed.  Risk Prescription drug management. Decision regarding hospitalization.   68 year old female presenting to the ED with right lower quadrant abdominal pain.  Has began on Sunday, progressively worsening.  Having poor appetite but no nausea or vomiting.  She is tender RLQ on exam.  Workup initiated in triage--no leukocytosis or  significant electrolyte derangement.  CT is positive for appendicitis, no perforation or abscess.  She is given preop antibiotics.  Cussed with general surgery, Dr. Derrell Lolling-- will see in consult in AM.  Final Clinical Impression(s) / ED Diagnoses Final diagnoses:  Other acute appendicitis    Rx / DC Orders ED Discharge Orders     None         Garlon Hatchet, PA-C 08/24/23 0546    Tilden Fossa, MD 08/24/23 [phone]

## 2023-08-24 NOTE — H&P (Signed)
 Kelly Hines is an 68 y.o. female.   Chief Complaint: abd pain HPI: PT is a 2F with pain since Sunday.  States pain has been gen and localized to RLQ area.  States that she had some malaise.  She denies n/v/d.  She underwent CT per EDP and was seen to have fecalith with appendicitis.  I reviewed the images personally.  No PSH  Past Medical History:  Diagnosis Date   Personal history of chemotherapy    Personal history of radiation therapy     Past Surgical History:  Procedure Laterality Date   BREAST LUMPECTOMY Right 2012   LITHOTRIPSY Left 03/09/2016    Family History  Problem Relation Age of Onset   COPD Mother    Parkinson's disease Father    Social History:  reports that she has never smoked. She has never used smokeless tobacco. She reports current alcohol use. No history on file for drug use.  Allergies:  Allergies  Allergen Reactions   Sulfa Antibiotics Nausea And Vomiting    (Not in a hospital admission)   Results for orders placed or performed during the hospital encounter of 08/23/23 (from the past 48 hours)  CBC     Status: None   Collection Time: 08/23/23  7:38 PM  Result Value Ref Range   WBC 8.9 4.0 - 10.5 K/uL   RBC 4.45 3.87 - 5.11 MIL/uL   Hemoglobin 13.7 12.0 - 15.0 g/dL   HCT 19.1 47.8 - 29.5 %   MCV 90.1 80.0 - 100.0 fL   MCH 30.8 26.0 - 34.0 pg   MCHC 34.2 30.0 - 36.0 g/dL   RDW 62.1 30.8 - 65.7 %   Platelets 191 150 - 400 K/uL   nRBC 0.0 0.0 - 0.2 %    Comment: Performed at [facility], [address] [place], [place] [address]  Comprehensive metabolic panel     Status: Abnormal   Collection Time: 08/23/23  7:38 PM  Result Value Ref Range   Sodium 132 (L) 135 - 145 mmol/L   Potassium 4.1 3.5 - 5.1 mmol/L   Chloride 94 (L) 98 - 111 mmol/L   CO2 24 22 - 32 mmol/L   Glucose, Bld 90 70 - 99 mg/dL    Comment: Glucose reference range applies only to samples taken after fasting for at least 8 hours.   BUN 9 8 - 23 mg/dL    Creatinine, Ser 2.95 0.44 - 1.00 mg/dL   Calcium 28.4 8.9 - 13.2 mg/dL   Total Protein 7.7 6.5 - 8.1 g/dL   Albumin 4.3 3.5 - 5.0 g/dL   AST 25 15 - 41 U/L   ALT 21 0 - 44 U/L   Alkaline Phosphatase 60 38 - 126 U/L   Total Bilirubin 1.0 0.0 - 1.2 mg/dL   GFR, Estimated >44 >01 mL/min    Comment: (NOTE) Calculated using the CKD-EPI Creatinine Equation (2021)    Anion gap 14 5 - 15    Comment: Performed at [facility], [address], [place], [place] [address]  Lipase, blood     Status: None   Collection Time: 08/23/23  7:38 PM  Result Value Ref Range   Lipase 37 11 - 51 U/L    Comment: Performed at [facility], [address] [place], [place] [address]  Urinalysis, Routine w reflex microscopic -Urine, Clean Catch     Status: Abnormal   Collection Time: 08/23/23  7:44 PM  Result Value  Ref Range   Color, Urine YELLOW YELLOW   APPearance CLEAR CLEAR   Specific Gravity, Urine 1.006 1.005 - 1.030   pH 7.0 5.0 - 8.0   Glucose, UA NEGATIVE NEGATIVE mg/dL   Hgb urine dipstick NEGATIVE NEGATIVE   Bilirubin Urine NEGATIVE NEGATIVE   Ketones, ur 20 (A) NEGATIVE mg/dL   Protein, ur NEGATIVE NEGATIVE mg/dL   Nitrite NEGATIVE NEGATIVE   Leukocytes,Ua NEGATIVE NEGATIVE    Comment: Performed at [facility], [address] [place], [place] [address]   CT ABDOMEN PELVIS W CONTRAST Result Date: 08/23/2023 CLINICAL DATA:  Right lower quadrant pain EXAM: CT ABDOMEN AND PELVIS WITH CONTRAST TECHNIQUE: Multidetector CT imaging of the abdomen and pelvis was performed using the standard protocol following bolus administration of intravenous contrast. RADIATION DOSE REDUCTION: This exam was performed according to the departmental dose-optimization program which includes automated exposure control, adjustment of the mA and/or kV according to patient size and/or use of iterative reconstruction technique. CONTRAST:  75mL OMNIPAQUE IOHEXOL 350 MG/ML SOLN COMPARISON:  None Available.  FINDINGS: Lower chest: No acute abnormality Hepatobiliary: No focal hepatic abnormality. Gallbladder unremarkable. Pancreas: No focal abnormality or ductal dilatation. Spleen: No focal abnormality.  Normal size. Adrenals/Urinary Tract: Bilateral renal cysts appear benign. No follow-up imaging recommended. Bilateral nephrolithiasis. No ureteral stones or hydronephrosis. Adrenal glands and urinary bladder unremarkable. Stomach/Bowel: Sigmoid diverticulosis. No active diverticulitis. Stomach and small bowel decompressed, unremarkable. Appendix is mildly dilated measuring 11 mm. Surrounding inflammation. Findings compatible with acute appendicitis. Vascular/Lymphatic: Aortic atherosclerosis. No evidence of aortic aneurysm or adenopathy. 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 Reproductive: Uterus and adnexa unremarkable.  No mass. Other: No free fluid or free air. Musculoskeletal: No acute bony abnormality. IMPRESSION: Dilated, inflamed appendix compatible with acute appendicitis. No complicating feature. Bilateral nephrolithiasis.  No hydronephrosis. Sigmoid diverticulosis.  No active diverticulitis. Aortic atherosclerosis. These results were called by telephone at the time of interpretation on 08/23/2023 at 9:19 pm to provider [facility] , who verbally acknowledged these results. Electronically Signed   By: Charlett Nose M.D.   On: 08/23/2023 21:22    Review of Systems  HENT:  Negative for ear discharge, ear pain, hearing loss and tinnitus.   Eyes:  Negative for photophobia and pain.  Respiratory:  Negative for cough and shortness of breath.   Cardiovascular:  Negative for chest pain.  Gastrointestinal:  Positive for abdominal pain. Negative for nausea and vomiting.  Genitourinary:  Negative for dysuria, flank pain, frequency and urgency.  Musculoskeletal:  Negative for back pain, myalgias and neck pain.  Neurological:  Negative for dizziness and headaches.   Hematological:  Does not bruise/bleed easily.  Psychiatric/Behavioral:  The patient is not nervous/anxious.     Blood pressure (!) 118/47, pulse (!) 101, temperature 98.4 F (36.9 C), temperature source Oral, resp. rate 17, SpO2 99%. Physical Exam Constitutional:      Appearance: She is well-developed.     Comments: Conversant No acute distress  HENT:     Head: Normocephalic and atraumatic.  Eyes:     General: Lids are normal. No scleral icterus.    Pupils: Pupils are equal, round, and reactive to light.     Comments: Pupils are equal round and reactive No lid lag Moist conjunctiva  Neck:     Thyroid: No thyromegaly.     Trachea: No tracheal tenderness.     Comments: No cervical lymphadenopathy Cardiovascular:     Rate and Rhythm:  Normal rate and regular rhythm.     Heart sounds: No murmur heard. Pulmonary:     Effort: Pulmonary effort is normal.     Breath sounds: Normal breath sounds. No wheezing or rales.  Abdominal:     Tenderness: There is abdominal tenderness in the right lower quadrant.     Hernia: No hernia is present.  Musculoskeletal:     Cervical back: Normal range of motion and neck supple.  Skin:    General: Skin is warm.     Findings: No rash.     Nails: There is no clubbing.     Comments: Normal skin turgor  Neurological:     Mental Status: She is alert and oriented to person, place, and time.     Comments: Normal gait and station  Psychiatric:        Mood and Affect: Mood normal.        Thought Content: Thought content normal.        Judgment: Judgment normal.     Comments: Appropriate affect      Assessment/Plan 59F with acute appendicitis To OR this AM with Dr. Corliss Skains for lap appy. I discussed with the patient the risks benefits of the procedure to include but not limited to: Infection, bleeding, damage to surrounding structures, possible ileus, possible postoperative infection. Patient voiced understanding and wishes to proceed.   Axel Filler, MD 08/24/2023, 6:49 AM

## 2023-08-24 NOTE — Anesthesia Procedure Notes (Signed)
 Procedure Name: Intubation Date/Time: 08/24/2023 10:46 AM  Performed by: Alwyn Ren, CRNAPre-anesthesia Checklist: Patient identified, Emergency Drugs available, Suction available and Patient being monitored Patient Re-evaluated:Patient Re-evaluated prior to induction Oxygen Delivery Method: Circle system utilized Preoxygenation: Pre-oxygenation with 100% oxygen Induction Type: IV induction Ventilation: Mask ventilation without difficulty Laryngoscope Size: Miller and 2 Grade View: Grade I Tube type: Oral Tube size: 7.0 mm Number of attempts: 1 Airway Equipment and Method: Stylet and Oral airway Placement Confirmation: ETT inserted through vocal cords under direct vision, positive ETCO2 and breath sounds checked- equal and bilateral Secured at: 20 cm Tube secured with: Tape Dental Injury: Teeth and Oropharynx as per pre-operative assessment

## 2023-08-24 NOTE — Anesthesia Postprocedure Evaluation (Signed)
 Anesthesia Post Note  Patient: Medical illustrator  Procedure(s) Performed: APPENDECTOMY LAPAROSCOPIC (Abdomen)     Patient location during evaluation: PACU Anesthesia Type: General Level of consciousness: awake and alert Pain management: pain level controlled Vital Signs Assessment: post-procedure vital signs reviewed and stable Respiratory status: spontaneous breathing, nonlabored ventilation and respiratory function stable Cardiovascular status: blood pressure returned to baseline and stable Postop Assessment: no apparent nausea or vomiting Anesthetic complications: no   No notable events documented.  Last Vitals:  Vitals:   08/24/23 1145 08/24/23 1200  BP: 134/60 (!) 143/56  Pulse: 83 83  Resp: 12 16  Temp:  36.8 C  SpO2: 98% 98%    Last Pain:  Vitals:   08/24/23 0945  TempSrc: Oral  PainSc: 1                  Lowella Curb

## 2023-08-24 NOTE — Op Note (Signed)
 Appendectomy, Lap, Procedure Note  Indications: The patient presented with a history of right-sided abdominal pain. A CT scan revealed findings consistent with acute appendicitis.  Pre-operative Diagnosis: Acute appendicitis without mention of peritonitis  Post-operative Diagnosis: Same  Surgeon: [name]   Assistants: Berenda Morale, RNFA  Anesthesia: General endotracheal anesthesia  ASA Class: 1  Procedure Details  The patient was seen again in the Holding Room. The risks, benefits, complications, treatment options, and expected outcomes were discussed with the patient and/or family. The possibilities of reaction to medication, perforation of viscus, bleeding, recurrent infection, finding a normal appendix, the need for additional procedures, failure to diagnose a condition, and creating a complication requiring transfusion or operation were discussed. There was concurrence with the proposed plan and informed consent was obtained. The site of surgery was properly noted. The patient was taken to Operating Room, identified as Medical illustrator and the procedure verified as Appendectomy. A Time Out was held and the above information confirmed.  The patient was placed in the supine position and general anesthesia was induced.  The abdomen was prepped and draped in a sterile fashion. A one centimeter infraumbilical incision was made.  Dissection was carried down to the fascia bluntly.  The fascia was incised vertically.  We entered the peritoneal cavity bluntly.  A pursestring suture was passed around the fascial opening with a 0 Vicryl.  The Hasson cannula was introduced into the abdomen and the tails of the suture were used to hold the Hasson in place.   The pneumoperitoneum was then established maintaining a maximum pressure of 15 mmHg.  Additional 5 mm cannulas then placed in the left lower quadrant of the abdomen and the epigastrium under direct visualization. A careful evaluation of  the entire abdomen was carried out. The patient was placed in Trendelenburg and left lateral decubitus position.  The scope was moved to the right upper quadrant port site. The cecum was mobilized medially.  The appendix was located in a retrocecal location.  The appendix was inflamed, but there was no sign of perforation, necrosis, or abscess.  The appendix was carefully dissected. The appendix was skeletonized with the harmonic scalpel.   The appendix was divided at its base using an endo-GIA stapler. Minimal appendiceal stump was left in place. There was no evidence of bleeding, leakage, or complication after division of the appendix. Irrigation was also performed and irrigate suctioned from the abdomen as well.  The umbilical port site was closed with the purse string suture. There was no residual palpable fascial defect.  The trocar site skin wounds were closed with 4-0 Monocryl.  Instrument, sponge, and needle counts were correct at the conclusion of the case.   Findings: The appendix was found to be inflamed. There were not signs of necrosis.  There was not perforation. There was not abscess formation.  Estimated Blood Loss:  Minimal         Drains: none         Specimens: appendix         Complications:  None; patient tolerated the procedure well.         Disposition: PACU - hemodynamically stable.         Condition: stable  Wilmon Arms. Corliss Skains, MD, [facility]  General Surgery   08/24/2023 11:19 AM

## 2023-08-25 ENCOUNTER — Encounter (HOSPITAL_COMMUNITY): Payer: Self-pay | Admitting: Surgery

## 2023-08-25 DIAGNOSIS — Z713 Dietary counseling and surveillance: Secondary | ICD-10-CM | POA: Diagnosis not present

## 2023-08-25 LAB — SURGICAL PATHOLOGY

## 2023-09-22 ENCOUNTER — Other Ambulatory Visit (HOSPITAL_COMMUNITY): Payer: Self-pay | Admitting: Internal Medicine

## 2023-09-22 DIAGNOSIS — I2510 Atherosclerotic heart disease of native coronary artery without angina pectoris: Secondary | ICD-10-CM

## 2023-09-30 ENCOUNTER — Ambulatory Visit (HOSPITAL_COMMUNITY)
Admission: RE | Admit: 2023-09-30 | Discharge: 2023-09-30 | Disposition: A | Discharge status: Home / Self Care | Payer: Self-pay | Source: Ambulatory Visit | Attending: Internal Medicine | Admitting: Internal Medicine

## 2023-09-30 DIAGNOSIS — I2510 Atherosclerotic heart disease of native coronary artery without angina pectoris: Secondary | ICD-10-CM | POA: Insufficient documentation

## 2023-10-05 DIAGNOSIS — E639 Nutritional deficiency, unspecified: Secondary | ICD-10-CM | POA: Diagnosis not present

## 2023-10-05 DIAGNOSIS — M810 Age-related osteoporosis without current pathological fracture: Secondary | ICD-10-CM | POA: Diagnosis not present

## 2023-12-02 DIAGNOSIS — Z1231 Encounter for screening mammogram for malignant neoplasm of breast: Secondary | ICD-10-CM | POA: Diagnosis not present

## 2023-12-22 DIAGNOSIS — I10 Essential (primary) hypertension: Secondary | ICD-10-CM | POA: Diagnosis not present

## 2023-12-22 DIAGNOSIS — M810 Age-related osteoporosis without current pathological fracture: Secondary | ICD-10-CM | POA: Diagnosis not present

## 2024-03-12 DIAGNOSIS — Z1283 Encounter for screening for malignant neoplasm of skin: Secondary | ICD-10-CM | POA: Diagnosis not present

## 2024-03-12 DIAGNOSIS — L603 Nail dystrophy: Secondary | ICD-10-CM | POA: Diagnosis not present

## 2024-03-12 DIAGNOSIS — D225 Melanocytic nevi of trunk: Secondary | ICD-10-CM | POA: Diagnosis not present

## 2024-03-12 DIAGNOSIS — L821 Other seborrheic keratosis: Secondary | ICD-10-CM | POA: Diagnosis not present

## 2024-04-23 DIAGNOSIS — H2513 Age-related nuclear cataract, bilateral: Secondary | ICD-10-CM | POA: Diagnosis not present

## 2024-04-23 DIAGNOSIS — H5203 Hypermetropia, bilateral: Secondary | ICD-10-CM | POA: Diagnosis not present

## 2024-04-23 DIAGNOSIS — H52203 Unspecified astigmatism, bilateral: Secondary | ICD-10-CM | POA: Diagnosis not present

## 2024-05-10 ENCOUNTER — Other Ambulatory Visit: Payer: Self-pay | Admitting: Family Medicine

## 2024-05-10 ENCOUNTER — Ambulatory Visit
Admission: RE | Admit: 2024-05-10 | Discharge: 2024-05-10 | Disposition: A | Discharge status: Home / Self Care | Source: Ambulatory Visit | Attending: Family Medicine | Admitting: Family Medicine

## 2024-05-10 DIAGNOSIS — M79671 Pain in right foot: Secondary | ICD-10-CM

## 2024-05-10 DIAGNOSIS — M25511 Pain in right shoulder: Secondary | ICD-10-CM

## 2024-05-10 DIAGNOSIS — M25551 Pain in right hip: Secondary | ICD-10-CM

## 2024-06-12 ENCOUNTER — Other Ambulatory Visit: Payer: Self-pay | Admitting: Family Medicine

## 2024-06-12 DIAGNOSIS — N950 Postmenopausal bleeding: Secondary | ICD-10-CM

## 2024-06-22 ENCOUNTER — Ambulatory Visit
Admission: RE | Admit: 2024-06-22 | Discharge: 2024-06-22 | Disposition: A | Discharge status: Home / Self Care | Source: Ambulatory Visit | Attending: Family Medicine | Admitting: Family Medicine

## 2024-06-22 ENCOUNTER — Other Ambulatory Visit: Payer: Self-pay | Admitting: Family Medicine

## 2024-06-22 DIAGNOSIS — Z87442 Personal history of urinary calculi: Secondary | ICD-10-CM

## 2024-06-22 DIAGNOSIS — N950 Postmenopausal bleeding: Secondary | ICD-10-CM

## 2024-06-26 ENCOUNTER — Ambulatory Visit
Admission: RE | Admit: 2024-06-26 | Discharge: 2024-06-26 | Disposition: A | Discharge status: Home / Self Care | Source: Ambulatory Visit | Attending: Family Medicine | Admitting: Family Medicine

## 2024-06-26 DIAGNOSIS — Z87442 Personal history of urinary calculi: Secondary | ICD-10-CM

## 2024-06-29 ENCOUNTER — Other Ambulatory Visit: Payer: Self-pay | Admitting: Family Medicine

## 2024-06-29 DIAGNOSIS — N950 Postmenopausal bleeding: Secondary | ICD-10-CM

## 2024-09-03 ENCOUNTER — Other Ambulatory Visit
